# Patient Record
Sex: Male | Born: 1990 | Race: White | Hispanic: No | Marital: Single | Smoking: Never smoker
Health system: Southern US, Community
[De-identification: ages and names within clinical notes are randomized; demographics above are authoritative.]

## PROBLEM LIST (undated history)

## (undated) DIAGNOSIS — E66811 Obesity, class 1: Secondary | ICD-10-CM

## (undated) DIAGNOSIS — IMO0002 Reserved for concepts with insufficient information to code with codable children: Secondary | ICD-10-CM

## (undated) DIAGNOSIS — J4530 Mild persistent asthma, uncomplicated: Secondary | ICD-10-CM

## (undated) DIAGNOSIS — R4681 Obsessive-compulsive behavior: Secondary | ICD-10-CM

## (undated) DIAGNOSIS — J302 Other seasonal allergic rhinitis: Secondary | ICD-10-CM

## (undated) DIAGNOSIS — F419 Anxiety disorder, unspecified: Secondary | ICD-10-CM

## (undated) DIAGNOSIS — E669 Obesity, unspecified: Secondary | ICD-10-CM

## (undated) HISTORY — DX: Obesity, class 1: E66.811

## (undated) HISTORY — DX: Obesity, unspecified: E66.9

## (undated) HISTORY — DX: Other seasonal allergic rhinitis: J30.2

## (undated) HISTORY — DX: Obsessive-compulsive behavior: R46.81

## (undated) HISTORY — DX: Mild persistent asthma, uncomplicated: J45.30

## (undated) HISTORY — PX: TOENAIL EXCISION: SHX183

---
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
## 2010-11-22 LAB — POCT TOXICOLOGY PANEL

## 2011-07-14 ENCOUNTER — Encounter: Payer: Self-pay | Admitting: Family Medicine

## 2011-07-14 ENCOUNTER — Other Ambulatory Visit: Payer: Self-pay | Admitting: Family Medicine

## 2011-07-14 ENCOUNTER — Ambulatory Visit (INDEPENDENT_AMBULATORY_CARE_PROVIDER_SITE_OTHER): Payer: 59 | Admitting: Family Medicine

## 2011-07-14 VITALS — BP 114/75 | HR 81 | Temp 98.9°F | Ht 68.75 in | Wt 210.0 lb

## 2011-07-14 DIAGNOSIS — L853 Xerosis cutis: Secondary | ICD-10-CM

## 2011-07-14 DIAGNOSIS — Z8249 Family history of ischemic heart disease and other diseases of the circulatory system: Secondary | ICD-10-CM

## 2011-07-14 DIAGNOSIS — R238 Other skin changes: Secondary | ICD-10-CM

## 2011-07-14 DIAGNOSIS — E669 Obesity, unspecified: Secondary | ICD-10-CM

## 2011-07-14 DIAGNOSIS — R635 Abnormal weight gain: Secondary | ICD-10-CM

## 2011-07-14 DIAGNOSIS — E66811 Obesity, class 1: Secondary | ICD-10-CM | POA: Insufficient documentation

## 2011-07-14 LAB — TSH: TSH: 2.629 u[IU]/mL (ref 0.350–4.500)

## 2011-07-14 NOTE — Progress Notes (Signed)
Office Note 07/14/2011  CC:  Chief Complaint  Patient presents with  . Establish Care    ? hypothyroid, "make sure everything is as it should be"    HPI:  Calvin Bray is a 21 y.o. White male who is here to establish care. Patient's most recent primary MD: Brooks Sailors in [place]. Old records were not reviewed prior to or during today's visit.  He presents today essentially requesting a screen for hypothyroidism "because my mom has it".  When asked about any symptoms he has that might support this dx he sites dry skin and coarse hair.  He also says that he has trouble consistently losing weight and keeping it off.  Past Medical History  Diagnosis Date  . Seasonal allergic rhinitis     Clartin helpful.  . Mild persistent asthma     Has been on advair and singulair in the past.  Has gotten better as he's gotten older but still flares some with allergies.  . Obesity, Class I, BMI 30-34.9     Past Surgical History  Procedure Date  . Toenail excision     Family History  Problem Relation Age of Onset  . Hypothyroidism Mother   . Heart disease Mother     CAD onset in her mid 43s  . Cancer Sister     ovarian cancer  . ADD / ADHD Brother   . Cancer Maternal Grandfather     lung cancer, died prior to age 25    History   Social History  . Marital Status: Single    Spouse Name: N/A    Number of Children: N/A  . Years of Education: N/A   Occupational History  . Not on file.   Social History Main Topics  . Smoking status: Never Smoker   . Smokeless tobacco: Never Used  . Alcohol Use: No  . Drug Use: No  . Sexually Active: Not on file   Other Topics Concern  . Not on file   Social History Narrative   Single, no children.Lives with parents, goes to [facility].Exercised regularly when school was in session [employer]).  Works at [employer] in [employer]No tobacco, alcohol, or drugs.    Outpatient Encounter Prescriptions as of 07/14/2011    Medication Sig Dispense Refill  . loratadine (CLARITIN REDITABS) 10 MG dissolvable tablet Take 10 mg by mouth daily.        No Known Allergies  ROS Review of Systems  Constitutional: Negative for fever, chills, appetite change and fatigue.  HENT: Negative for ear pain, congestion, sore throat, neck stiffness and dental problem.   Eyes: Negative for discharge, redness and visual disturbance.  Respiratory: Negative for cough, chest tightness, shortness of breath and wheezing.   Cardiovascular: Negative for chest pain, palpitations and leg swelling.  Gastrointestinal: Negative for nausea, vomiting, abdominal pain, diarrhea and blood in stool.  Genitourinary: Negative for dysuria, urgency, frequency, hematuria, flank pain and difficulty urinating.  Musculoskeletal: Negative for myalgias, back pain, joint swelling and arthralgias.  Skin: Negative for pallor and rash.  Neurological: Negative for dizziness, speech difficulty, weakness and headaches.  Hematological: Negative for adenopathy. Does not bruise/bleed easily.  Psychiatric/Behavioral: Negative for confusion and sleep disturbance. The patient is not nervous/anxious.     PE; Blood pressure 114/75, pulse 81, temperature 98.9 F (37.2 C), temperature source Temporal, height 5' 8.75" (1.746 m), weight 210 lb (95.255 kg), SpO2 97.00%. Gen: Alert, well appearing, obese white male.  Patient is oriented to person, place, time,  and situation. Affect is blunted, thought and speech are lucid. ENT: Ears: EACs clear, normal epithelium.  TMs with good light reflex and landmarks bilaterally.  Eyes: no injection, icteris, swelling, or exudate.  EOMI, PERRLA. Nose: no drainage or turbinate edema/swelling.  No injection or focal lesion.  Mouth: lips without lesion/swelling.  Oral mucosa pink and moist.  Dentition intact and without obvious caries or gingival swelling.  Oropharynx without erythema, exudate, or swelling.  Neck - No masses or thyromegaly  or limitation in range of motion CV: RRR, no m/r/g.   LUNGS: CTA bilat, nonlabored resps, good aeration in all lung fields. ABD: soft, NT, ND, BS normal.  No hepatospenomegaly or mass.  No bruits. EXT: no clubbing, cyanosis, or edema.  Skin - no sores or suspicious lesions or rashes or color changes.  His body hair appears normal.  Pertinent labs:  None today  ASSESSMENT AND PLAN:   New pt; obtain old records.  Obesity, Class I, BMI 30-34.9 He is worried about his inability to lose wt being a sign of hypothyroidism, especially with his FH of this problem (mother). He also sites dry skin and coarse hair but I didn't really find either of these on exam today. Will do TSH screen today.  Encouraged pt to make separate lab appt when fasting for lipid panel and glucose screening (due to his weight and FH of early CAD). Discussed diet and exercise today but patient really seemed disinterested in this conversation.     Return if symptoms worsen or fail to improve.

## 2011-07-14 NOTE — Assessment & Plan Note (Signed)
He is worried about his inability to lose wt being a sign of hypothyroidism, especially with his FH of this problem (mother). He also sites dry skin and coarse hair but I didn't really find either of these on exam today. Will do TSH screen today.  Encouraged pt to make separate lab appt when fasting for lipid panel and glucose screening (due to his weight and FH of early CAD). Discussed diet and exercise today but patient really seemed disinterested in this conversation.

## 2011-07-21 ENCOUNTER — Other Ambulatory Visit: Payer: 59

## 2011-11-21 DIAGNOSIS — IMO0002 Reserved for concepts with insufficient information to code with codable children: Secondary | ICD-10-CM

## 2011-11-21 HISTORY — DX: Reserved for concepts with insufficient information to code with codable children: IMO0002

## 2011-12-02 ENCOUNTER — Encounter (HOSPITAL_BASED_OUTPATIENT_CLINIC_OR_DEPARTMENT_OTHER): Payer: Self-pay | Admitting: *Deleted

## 2011-12-02 ENCOUNTER — Emergency Department (HOSPITAL_BASED_OUTPATIENT_CLINIC_OR_DEPARTMENT_OTHER)
Admission: EM | Admit: 2011-12-02 | Discharge: 2011-12-02 | Disposition: A | Discharge status: Home / Self Care | Payer: 59 | Attending: Emergency Medicine | Admitting: Emergency Medicine

## 2011-12-02 DIAGNOSIS — Z6830 Body mass index (BMI) 30.0-30.9, adult: Secondary | ICD-10-CM | POA: Insufficient documentation

## 2011-12-02 DIAGNOSIS — E669 Obesity, unspecified: Secondary | ICD-10-CM | POA: Insufficient documentation

## 2011-12-02 DIAGNOSIS — T7840XA Allergy, unspecified, initial encounter: Secondary | ICD-10-CM

## 2011-12-02 DIAGNOSIS — L272 Dermatitis due to ingested food: Secondary | ICD-10-CM | POA: Insufficient documentation

## 2011-12-02 LAB — RAPID URINE DRUG SCREEN, HOSP PERFORMED
Benzodiazepines: NOT DETECTED
Cocaine: NOT DETECTED

## 2011-12-02 LAB — URINALYSIS, ROUTINE W REFLEX MICROSCOPIC
Bilirubin Urine: NEGATIVE
Leukocytes, UA: NEGATIVE
Nitrite: NEGATIVE
Specific Gravity, Urine: 1.005 (ref 1.005–1.030)
pH: 6 (ref 5.0–8.0)

## 2011-12-02 MED ORDER — EPINEPHRINE 0.3 MG/0.3ML IJ DEVI: 0.3000 mg | INTRAMUSCULAR | Status: AC | PRN

## 2011-12-02 MED ORDER — SODIUM CHLORIDE 0.9 % IV BOLUS (SEPSIS)
1000.0000 mL | Freq: Once | INTRAVENOUS | Status: AC
  Administered 2011-12-02: 1000 mL via INTRAVENOUS

## 2011-12-02 MED ORDER — METHYLPREDNISOLONE SODIUM SUCC 125 MG IJ SOLR
125.0000 mg | Freq: Once | INTRAMUSCULAR | Status: AC
  Administered 2011-12-02: 125 mg via INTRAVENOUS
  Filled 2011-12-02: qty 2

## 2011-12-02 MED ORDER — PREDNISONE 20 MG PO TABS: 40.0000 mg | ORAL_TABLET | Freq: Every day | ORAL | Status: DC

## 2011-12-02 NOTE — ED Provider Notes (Addendum)
History  This chart was scribed for Gwyneth Sprout, MD by Ladona Ridgel Day. This patient was seen in room MH12/MH12 and the patient's care was started at 1946.  CSN: [id number]  Arrival date & time 12/02/11  1946   First MD Initiated Contact with Patient 12/02/11 2038     Chief Complaint  Patient presents with  . Allergic Reaction   The history is provided by the patient. No language interpreter was used.   Calvin Bray is a 21 y.o. male who presents to the Emergency Department complaining of allergic reaction this PM 10 minutes after eating chinese food at home with associated red skin area of his right neck, pallor, and nausea. EMS was called on site but his parents brought him in, he had 50 mg benadryl at 1900 PO. He has no known allergies to medicines and no previous episodes similar to today.   Past Medical History  Diagnosis Date  . Seasonal allergic rhinitis     Clartin helpful.  . Mild persistent asthma     Has been on advair and singulair in the past.  Has gotten better as he's gotten older but still flares some with allergies.  . Obesity, Class I, BMI 30-34.9     Past Surgical History  Procedure Date  . Toenail excision     Family History  Problem Relation Age of Onset  . Hypothyroidism Mother   . Heart disease Mother     CAD onset in her mid 28s  . Cancer Sister     ovarian cancer  . ADD / ADHD Brother   . Cancer Maternal Grandfather     lung cancer, died prior to age 2    History  Substance Use Topics  . Smoking status: Never Smoker   . Smokeless tobacco: Never Used  . Alcohol Use: No      Review of Systems A complete 10 system review of systems was obtained and all systems are negative except as noted in the HPI and PMH.   Allergies  Review of patient's allergies indicates no known allergies.  Home Medications   Current Outpatient Rx  Name Route Sig Dispense Refill  . LORATADINE 10 MG PO TBDP Oral Take 10 mg by mouth daily.      Triage  Vitals: BP 138/79  Pulse 116  Temp 98.2 F (36.8 C) (Oral)  Resp 20  Ht 5\' 9"  (1.753 m)  Wt 210 lb (95.255 kg)  BMI 31.01 kg/m2  SpO2 99%  Physical Exam  Nursing note and vitals reviewed. Constitutional: He is oriented to person, place, and time. He appears well-developed and well-nourished. No distress.  HENT:  Head: Normocephalic and atraumatic.  Mouth/Throat: Oropharynx is clear and moist.       No oropharyngeal swelling, uvula is midline  Eyes: EOM are normal.  Neck: Neck supple. No tracheal deviation present.  Cardiovascular: Regular rhythm and normal heart sounds.        tachycardia  Pulmonary/Chest: Effort normal and breath sounds normal. No respiratory distress. He has no wheezes. He has no rales.  Abdominal: Soft. He exhibits no distension. There is no tenderness. There is no rebound and no guarding.  Musculoskeletal: Normal range of motion.  Neurological: He is alert and oriented to person, place, and time.  Skin: Skin is warm and dry.       Mild diaphoretic  Psychiatric: He has a normal mood and affect. His behavior is normal.    ED Course  Procedures (including critical care time)  DIAGNOSTIC STUDIES: Oxygen Saturation is 99% on room air, normal by my interpretation.    COORDINATION OF CARE: At 845 PM Discussed treatment plan with patient which includes solu-medrol and IV fluids. Patient agrees.   Labs Reviewed - No data to display No results found.   Date: 12/02/2011  Rate: 123  Rhythm: sinus tachycardia  QRS Axis: normal  Intervals: normal  ST/T Wave abnormalities: normal  Conduction Disutrbances:none  Narrative Interpretation:   Old EKG Reviewed: none available   1. Allergic reaction       MDM  Patient most likely with an allergic reaction to [place] food tonight. He had eaten egg rolls, and drops to, shrimp and 5 minutes later started having palpitations, abdominal cramping, mild difficulty breathing and not feeling well. His heart rate was up  to 140 at home and he was given 50 mg of Benadryl by his mother. Here patient still appears to not feel well. His heart rate still between 115 and 120. He is having no respiratory difficulty at this time and has no uvular or tongue edema. EKG shows normal sinus rhythm.  Blood pressure was within normal limits without signs of anaphylaxis to Patient given Solu-Medrol and IV fluids. We'll recheck to see if there is improvement  9:18 PM Patient asked his parents to leave the room and asked the nurse if he could be poisoned. He was concerned that his parents may be trying to poison him because they did not eat the chicken chow mein. Patient denies hallucinating and denies feeling paranoid elsewhere. He states he's never had any problems with his parents however has a flat affect and acting in an odd manner.  9:58 PM On further questioning pt has been taking provigil that he got off the internet and last took it at noon.  This is most likely the reason for his odd behavior.  10:32 PM On reevaluation patient states he starting to feel better. He states he is less concerned about being poisoned now. However he states he would feel better if your drug screen was performed. This test was ordered. Pulse now improved to 82.  UA neg.  Will dc home.   I personally performed the services described in this documentation, which was scribed in my presence.  The recorded information has been reviewed and considered.          Gwyneth Sprout, MD 12/02/11 1610  Gwyneth Sprout, MD 12/02/11 9604  Gwyneth Sprout, MD 12/02/11 5409  Gwyneth Sprout, MD 12/02/11 8119  Gwyneth Sprout, MD 12/02/11 2308

## 2011-12-02 NOTE — ED Notes (Signed)
rx x 2 given for prednisone and epi-pen- d/c home with family

## 2011-12-02 NOTE — ED Notes (Signed)
Pt ate [place] food tonight and had a ? Reaction to same. EMS came out and checked pt. Brought to ED by family. No distress. Given Benadryl 50 mg PO at 1900.

## 2011-12-04 ENCOUNTER — Emergency Department (HOSPITAL_BASED_OUTPATIENT_CLINIC_OR_DEPARTMENT_OTHER)
Admission: EM | Admit: 2011-12-04 | Discharge: 2011-12-04 | Disposition: A | Discharge status: Home / Self Care | Payer: 59 | Attending: Emergency Medicine | Admitting: Emergency Medicine

## 2011-12-04 ENCOUNTER — Encounter (HOSPITAL_BASED_OUTPATIENT_CLINIC_OR_DEPARTMENT_OTHER): Payer: Self-pay

## 2011-12-04 DIAGNOSIS — R000 Tachycardia, unspecified: Secondary | ICD-10-CM | POA: Insufficient documentation

## 2011-12-04 DIAGNOSIS — J45909 Unspecified asthma, uncomplicated: Secondary | ICD-10-CM | POA: Insufficient documentation

## 2011-12-04 DIAGNOSIS — R110 Nausea: Secondary | ICD-10-CM | POA: Insufficient documentation

## 2011-12-04 DIAGNOSIS — F411 Generalized anxiety disorder: Secondary | ICD-10-CM | POA: Insufficient documentation

## 2011-12-04 DIAGNOSIS — F419 Anxiety disorder, unspecified: Secondary | ICD-10-CM

## 2011-12-04 HISTORY — DX: Reserved for concepts with insufficient information to code with codable children: IMO0002

## 2011-12-04 LAB — CBC WITH DIFFERENTIAL/PLATELET
Basophils Absolute: 0 10*3/uL (ref 0.0–0.1)
Basophils Relative: 0 % (ref 0–1)
Eosinophils Relative: 0 % (ref 0–5)
HCT: 42.6 % (ref 39.0–52.0)
MCHC: 34.5 g/dL (ref 30.0–36.0)
MCV: 86.2 fL (ref 78.0–100.0)
Monocytes Absolute: 1.1 10*3/uL — ABNORMAL HIGH (ref 0.1–1.0)
RDW: 13.5 % (ref 11.5–15.5)

## 2011-12-04 LAB — COMPREHENSIVE METABOLIC PANEL
AST: 21 U/L (ref 0–37)
Albumin: 4.7 g/dL (ref 3.5–5.2)
CO2: 23 mEq/L (ref 19–32)
Calcium: 9.9 mg/dL (ref 8.4–10.5)
Creatinine, Ser: 0.8 mg/dL (ref 0.50–1.35)
GFR calc non Af Amer: >90 mL/min (ref >90)

## 2011-12-04 MED ORDER — SODIUM CHLORIDE 0.9 % IV BOLUS (SEPSIS)
1000.0000 mL | Freq: Once | INTRAVENOUS | Status: AC
  Administered 2011-12-04: 1000 mL via INTRAVENOUS

## 2011-12-04 MED ORDER — LORAZEPAM 2 MG/ML IJ SOLN
1.0000 mg | Freq: Once | INTRAMUSCULAR | Status: AC
  Administered 2011-12-04: 1 mg via INTRAVENOUS
  Filled 2011-12-04: qty 1

## 2011-12-04 NOTE — ED Notes (Signed)
Patient here reporting that he awoke this am with feeling cold and having muscle spasms. On assessment reports that he feels anxious/nervous. Was here on Saturday for same. Reports that he is sleeping at night but awakening feeling anxious

## 2011-12-04 NOTE — ED Provider Notes (Signed)
History     CSN: [id number]  Arrival date & time 12/04/11  2956   First MD Initiated Contact with Patient 12/04/11 0357      Chief Complaint  Patient presents with  . Anxiety    (Consider location/radiation/quality/duration/timing/severity/associated sxs/prior treatment) HPI This patient presents with concerns of diffuse spasm sensation, cold sensation.  The patient also states that he is nervous and anxious. Notably, the patient was seen here approximately 32 hours ago following a somewhat similar presentation.  That event occurred after intake of food, and allergic reaction was/is considered as a precipitant.  He notes that since discharge he has remained anxious, with mild intermittent diffuse muscle discomfort. The patient notes no clear alleviating or exacerbating factors. The patient states that over the past week he has been sleeping trivial amounts 2 to school requirements.  He is taking modafinil, a sepsis he previously used intermittently, but is now taking consistently for the past week. He denies focal pain, any dyspnea, fever, vomiting, diarrhea. Past Medical History  Diagnosis Date  . Seasonal allergic rhinitis     Clartin helpful.  . Mild persistent asthma     Has been on advair and singulair in the past.  Has gotten better as he's gotten older but still flares some with allergies.  . Obesity, Class I, BMI 30-34.9     Past Surgical History  Procedure Date  . Toenail excision     Family History  Problem Relation Age of Onset  . Hypothyroidism Mother   . Heart disease Mother     CAD onset in her mid 94s  . Cancer Sister     ovarian cancer  . ADD / ADHD Brother   . Cancer Maternal Grandfather     lung cancer, died prior to age 38    History  Substance Use Topics  . Smoking status: Never Smoker   . Smokeless tobacco: Never Used  . Alcohol Use: No      Review of Systems  Constitutional:       Per HPI, otherwise negative  HENT:       Per HPI,  otherwise negative  Eyes: Negative.   Respiratory:       Per HPI, otherwise negative  Cardiovascular:       Per HPI, otherwise negative  Gastrointestinal: Positive for nausea. Negative for vomiting and diarrhea.  Genitourinary: Negative.   Musculoskeletal:       Per HPI, otherwise negative  Skin: Negative.   Neurological: Negative for weakness, numbness and headaches.  Psychiatric/Behavioral: Positive for disturbed wake/sleep cycle and decreased concentration. The patient is nervous/anxious and is hyperactive.     Allergies  Review of patient's allergies indicates no known allergies.  Home Medications   Current Outpatient Rx  Name Route Sig Dispense Refill  . EPINEPHRINE 0.3 MG/0.3ML IJ DEVI Intramuscular Inject 0.3 mLs (0.3 mg total) into the muscle as needed. 1 Device 1  . LORATADINE 10 MG PO TBDP Oral Take 10 mg by mouth daily.    Marland Kitchen PREDNISONE 20 MG PO TABS Oral Take 2 tablets (40 mg total) by mouth daily. 10 tablet 0    BP 135/71  Pulse 116  Temp 97.4 F (36.3 C)  Resp 18  SpO2 99%  Physical Exam  Nursing note and vitals reviewed. Constitutional: He is oriented to person, place, and time. He appears well-developed. No distress.       Visibly anxious young male  HENT:  Head: Normocephalic and atraumatic.  Eyes: Conjunctivae normal and  EOM are normal.  Cardiovascular: Regular rhythm.  Tachycardia present.   Pulmonary/Chest: Effort normal. No stridor. No respiratory distress.  Abdominal: He exhibits no distension.  Musculoskeletal: He exhibits no edema.       No deformities, no ecchymosis, no rash.  The patient has no active visible spasms throughout  Neurological: He is alert and oriented to person, place, and time.  Skin: Skin is warm and dry.  Psychiatric: He has a normal mood and affect.    ED Course  Procedures (including critical care time)  Labs Reviewed  CBC WITH DIFFERENTIAL - Abnormal; Notable for the following:    WBC 17.1 (*)     Neutrophils  Relative 80 (*)     Neutro Abs 13.6 (*)     Monocytes Absolute 1.1 (*)     All other components within normal limits  COMPREHENSIVE METABOLIC PANEL   No results found.   No diagnosis found.   4:54 AM Patient informed of all results.  He appears significantly more comfortable.  No active distress.  He is resting, watching TV.  6:32 AM Patient sleeping. MDM  This young male presents with concerns of ongoing diffuse spasm and anxiety.  On exam he is anxious appearing, though in no distress.  Patient is appropriately interactive.  Patient is tachycardic, but otherwise has a reassuring physical exam.  Patient's labs temperature mild leukocytosis, possibly due to his recent use of steroids.  Absence of fever is reassuring.  We discussed the need for cessation of the patient's modafinil, close outpatient monitoring, and medication titration with his primary care physician.  After a period of observation the patient requested following improvement in his condition, he was discharged in stable condition.  Gerhard Munch, MD 12/04/11 [phone]

## 2012-01-15 ENCOUNTER — Encounter: Payer: Self-pay | Admitting: Family Medicine

## 2012-01-15 ENCOUNTER — Ambulatory Visit (HOSPITAL_COMMUNITY)
Admission: RE | Admit: 2012-01-15 | Discharge: 2012-01-15 | Disposition: A | Discharge status: Home / Self Care | Payer: 59 | Attending: Psychiatry | Admitting: Psychiatry

## 2012-01-15 ENCOUNTER — Ambulatory Visit (INDEPENDENT_AMBULATORY_CARE_PROVIDER_SITE_OTHER): Payer: 59 | Admitting: Family Medicine

## 2012-01-15 ENCOUNTER — Encounter (HOSPITAL_COMMUNITY): Payer: Self-pay | Admitting: *Deleted

## 2012-01-15 VITALS — BP 109/75 | HR 80 | Temp 99.2°F | Ht 68.75 in | Wt 196.0 lb

## 2012-01-15 DIAGNOSIS — F200 Paranoid schizophrenia: Secondary | ICD-10-CM

## 2012-01-15 DIAGNOSIS — F429 Obsessive-compulsive disorder, unspecified: Secondary | ICD-10-CM

## 2012-01-15 MED ORDER — FLUOXETINE HCL 20 MG PO TABS: 20.0000 mg | ORAL_TABLET | Freq: Every day | ORAL | Status: DC

## 2012-01-15 NOTE — Progress Notes (Signed)
OFFICE NOTE  01/15/2012  CC:  Chief Complaint  Patient presents with  . Follow-up    hospital visit for anxiety     HPI: Patient is a 21 y.o. Caucasian male who is here for panic sx's. He is hard to get information from.  He cannot describe what he feels but if given multiple choices to choose from he does fair.  He talks about a long history of OCD behavior, including the following examples: always had to hit the liquid soap dispenser 4 times, tap the water faucet 4 times, wash hands multiple times.   Has ritualistic ways of walking on patterned floors.  Says lately he has had panic sx's that seem to build up slowly from persistent thoughts that his parents want to hurt/kill him.  He interprets innocent speech and behavior on their part as "trying to kill him".  He admits that the thoughts are not rational but he cannot control them right now. Admits to several year hx of hearing voices that others don't hear.  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  Also expressed deluded/paranoid thoughts regarding a neighbor girl and a male classmate.  Then, lately he has had these paranoid delusions that his parents are conspiring to kill him. He says he has had varying degrees of success in "telling the voices to shut up" in the past.  Denies ever being treated for schizophrenia or similar dx.  He did admit to being on provigil that he obtained over the internet in October this year when he presented to the ED twice, but says that since that time he has not taken any more of this med (says he used it b/c he needed to stay up and complete school projects).  Denies abuse of any substances.  Says he drinks rarely.   During interview today, he would frequently act like he got stopped in his own thought process.   Speech was not pressured.    Pertinent PMH:  Past Medical History  Diagnosis Date  . Seasonal allergic rhinitis     Clartin helpful.  . Mild persistent  asthma     Has been on advair and singulair in the past.  Has gotten better as he's gotten older but still flares some with allergies.  . Obesity, Class I, BMI 30-34.9   . 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 (UDS neg)   Past Surgical History  Procedure Date  . Toenail excision    History   Social History  . Marital Status: Single    Spouse Name: N/A    Number of Children: N/A  . Years of Education: N/A   Occupational History  . Not on file.   Social History Main Topics  . Smoking status: Never Smoker   . Smokeless tobacco: Never Used  . Alcohol Use: No  . Drug Use: No  . Sexually Active: Not on file   Other Topics Concern  . Not on file   Social History Narrative   Single, no children.Lives with parents, goes to [facility] [employer] [employer].Exercised regularly when school was in session [employer]).  Works at [employer] in [employer]No tobacco, alcohol, or drugs.  Pt says he is transferring to [employer] in the spring of 2014.   MEDS:  Outpatient Prescriptions Prior to Visit  Medication Sig Dispense Refill  . EPINEPHrine (EPIPEN) 0.3 mg/0.3 mL DEVI Inject 0.3 mLs (0.3 mg total) into the muscle as needed.  1 Device  1  . loratadine (CLARITIN REDITABS) 10 MG dissolvable tablet Take 10 mg by mouth daily.        PE: Blood pressure 109/75, pulse 80, temperature 99.2 F (37.3 C), temperature source Temporal, height 5' 8.75" (1.746 m), weight 196 lb (88.905 kg). Gen: Alert, well appearing, calm.  Patient is oriented to person, place, time, and situation. Affect: flat, talks slowly, moves slowly.  He had intense eye contact with me most of the time. He had no pressured speech. No tremor.   CV: RRR, no m/r/g.   LUNGS: CTA bilat, nonlabored resps, good aeration in all lung  fields.   IMPRESSION AND PLAN:  Schizophrenia, paranoid, chronic Discussed urgency of getting control of this problem with pt and he agreed to proceed to [facility] now for further expert eval and possible admission for inpatient management since the paranoia regarding his parents is overwhelming his thought processes right now.  OCD (obsessive compulsive disorder) Leading to some panic sx's frequently. Start prozac 20mg  qd. I sent this rx to his pharmacy today but then we decided he should go directly to [facility] now--straight from our office--so he may have to wait to get this started or they may choose to treat differently after eval at [facility].   An After Visit Summary was printed and given to the patient.  35 minutes spent with pt today, with >50% of this time spent in counseling and care coordination for the above problems.  FOLLOW UP: as needed

## 2012-01-15 NOTE — BH Assessment (Signed)
Assessment Note   Calvin Bray is an 21 y.o. male. Pt presents to [facility] for an evaluation as referred by his family practitioner with whom he saw today for a routine visit per pt's report. Pt reports "its a complicated story" when prompted to explain why he was presenting to [facility] today.Pt reports that he has been hospitalized in the ER a few times in the past couple of months due to his increased level of anxiety and panic attacks. Pt reports that his anxiety is triggered by his stress levels related to his workload at [employer],as pt reports he is finishing his associates degree this semester in [employer], and has been accepted  and will be transferring to [employer] in the spring. Pt reports that he feels like his parents are trying to poison his food, subsequently choosing to eat out rather than eat their food. Pt states that his parents are acting weird but is unable to specify how when questioned about his perception of his parent's behaviors. Pt reports recently going to the [employer] over the past weekend with his family including his parents and reports that his younger cousin pinched his butt and that made him feel "weird".  Pt reports that his OCD behaviors and anxiety are getting worse, where he has the persistent urge to do things over and over again(nos). Pt reports that his family practitioner started him on Prozac today but he has not started taking the medications yet. Pt reports a hx of hearing voices(non-command) and reports he has been able to speak to the voices and tell them to stop and they did. Pt presents with disorganized thinking at times. No Hx of violent or aggressive behaviors reported or noted. Pt has not received any mental health treatment in the past. Pt denies current AVH,SI,and HI. Pt presents with the impression that he will be prescribed psychiatric medications for his paranoia. As pt does ask if [facility] can prescribe him medications today. Assessor  explained to the pt that he could schedule an appt. at Our outpt clinic in [place] or [place] for a medication evaluation. Pt requested to be referred to a provider in [place]. Pt was also offered PSYCH IOP and declined at this time. Pt requesting to follow-up with a psychiatrist at this time.  Consulted with [facility] Thurman Coyer and on Call Physician Dr. Geoffery Lyons who recommends that pt follow up with outpatient provider as pt is in agreement and willing to do so. Pt was given mental health crisis resources as needed and those resources were explained to pt. Pt was also given a list of outpatient providers and was strongly recommended to schedule an appt. With a provider tommorrow morning. Pt agreed to do so. Assessor informed pt that if his symptoms persisted or got worse to contact crisis line telephone number,call 911 and present to local emergency room as needed.  Axis I: 295.30 Schizophrenia,Paranoid Type Axis II: Deferred Axis III:  Past Medical History  Diagnosis Date  . Seasonal allergic rhinitis     Clartin helpful.  . Mild persistent asthma     Has been on advair and singulair in the past.  Has gotten better as he's gotten older but still flares some with allergies.  . Obesity, Class I, BMI 30-34.9   . 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 (UDS neg)   Axis IV: educational problems, other psychosocial or environmental problems and problems with primary support group Axis V: 51-60 moderate symptoms  Past Medical History:  Past Medical History  Diagnosis Date  . Seasonal allergic rhinitis     Clartin helpful.  . Mild persistent asthma     Has been on advair and singulair in the past.  Has gotten better as he's gotten older but still flares some with allergies.  . Obesity, Class I, BMI 30-34.9    . 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 (UDS neg)    Past Surgical History  Procedure Date  . Toenail excision     Family History:  Family History  Problem Relation Age of Onset  . Hypothyroidism Mother   . Heart disease Mother     CAD onset in her mid 32s  . Cancer Sister     ovarian cancer  . ADD / ADHD Brother   . Cancer Maternal Grandfather     lung cancer, died prior to age 85    Social History:  reports that he has never smoked. He has never used smokeless tobacco. He reports that he does not drink alcohol or use illicit drugs.  Additional Social History:  Alcohol / Drug Use Pain Medications:  (None Reported) Prescriptions:  (per pt family practitioner prescribed him Prozac today) Over the Counter:  (None Reported) History of alcohol / drug use?: No history of alcohol / drug abuse (reports drinking a beer "here or there" maybe 1x per month) Longest period of sobriety (when/how long):  (na)  CIWA:   COWS:    Allergies: No Known Allergies  Home Medications:  (Not in a hospital admission)  OB/GYN Status:  No LMP for male patient.  General Assessment Data Location of Assessment: [facility] Assessment Services Living Arrangements: Parent Can pt return to current living arrangement?: Yes Transfer from: Home Referral Source: Other (family practitioner )  Education Status Is patient currently in school?: Yes Current Grade: 2 year at [employer] Highest grade of school patient has completed: [employer] and some college Name of school: [employer]  Risk to self Suicidal Ideation: No Suicidal Intent: No Is patient at risk for suicide?: No Suicidal Plan?: No Access to Means: No What has been your use of drugs/alcohol within the last 12 months?: occasional beer  Previous  Attempts/Gestures: No How many times?: 0  Other Self Harm Risks: none reported Triggers for Past Attempts: None known Intentional Self Injurious Behavior: None Family Suicide History: No (pt thinks his brother maybe schizophrenic) Recent stressful life event(s): Other (Comment) (stressed about workload for college courses) Persecutory voices/beliefs?: No Depression: No Depression Symptoms: Insomnia Substance abuse history and/or treatment for substance abuse?: No Suicide prevention information given to non-admitted patients: Yes  Risk to Others Homicidal Ideation: No Thoughts of Harm to Others: No Current Homicidal Intent: No Current Homicidal Plan: No Access to Homicidal Means: No Identified Victim: na History of harm to others?: No Assessment of Violence: None Noted Violent Behavior Description: None Noted,Cooperative Does patient have access to weapons?: No (father has firearms in the home that are secured per pt) Criminal Charges Pending?: No Does patient have a court date: No (father is a retired Emergency planning/management officer per pts report)  Psychosis Hallucinations: None noted (pt reports hx of  paranoia,and voices non command) Delusions: None noted  Mental Status Report Appear/Hygiene: Other (Comment) (Appropriate) Eye Contact: Fair Motor Activity: Freedom of movement Speech: Logical/coherent Level of Consciousness: Alert Mood: Other (Comment) (Flat,disorganized thoughts at times) Affect: Anxious;Appropriate to circumstance Anxiety Level: Minimal (reports a hx of frequent panic attacks,ocd,anxiety) Thought Processes: Coherent;Relevant;Circumstantial Judgement: Unimpaired Orientation: Person;Place;Time;Situation Obsessive Compulsive Thoughts/Behaviors: Minimal (pt reports his ocd behaviors have gotten worse-nos, anxious)  Cognitive Functioning Concentration: Decreased Memory: Recent Intact;Remote Intact IQ: Average Insight: Fair Impulse Control: Fair Appetite: Fair Weight  Loss: 0  Weight Gain: 0  Sleep: Decreased Total Hours of Sleep: 4  Vegetative Symptoms: None  ADLScreening [facility] Assessment Services) Patient's cognitive ability adequate to safely complete daily activities?: Yes Patient able to express need for assistance with ADLs?: Yes Independently performs ADLs?: Yes (appropriate for developmental age)  Abuse/Neglect [facility]) Physical Abuse: Denies Verbal Abuse: Denies Sexual Abuse: Denies  Prior Inpatient Therapy Prior Inpatient Therapy: No Prior Therapy Dates: na Prior Therapy Facilty/Provider(s): na Reason for Treatment: na  Prior Outpatient Therapy Prior Outpatient Therapy: No Prior Therapy Dates: na Prior Therapy Facilty/Provider(s): na Reason for Treatment: na  ADL Screening (condition at time of admission) Patient's cognitive ability adequate to safely complete daily activities?: Yes Patient able to express need for assistance with ADLs?: Yes Independently performs ADLs?: Yes (appropriate for developmental age) Weakness of Legs: None Weakness of Arms/Hands: None  Home Assistive Devices/Equipment Home Assistive Devices/Equipment: None;Other (Comment) (wears eyeglassess)    Abuse/Neglect Assessment (Assessment to be complete while patient is alone) Physical Abuse: Denies Verbal Abuse: Denies Sexual Abuse: Denies Exploitation of patient/patient's resources: Denies Self-Neglect: Denies     Merchant navy officer (For Healthcare) Advance Directive: Patient does not have advance directive;Patient would like information Patient requests advance directive information: Advance directive packet given Nutrition Screen- [facility] Adult/WL/AP Have you recently lost weight without trying?: No Have you been eating poorly because of a decreased appetite?: No Malnutrition Screening Tool Score: 0   Additional Information 1:1 In Past 12 Months?: No CIRT Risk: No Elopement Risk: No Does patient have medical clearance?: No     Disposition:    Disposition Disposition of Patient: Outpatient treatment Type of outpatient treatment: Adult  On Site Evaluation by:   Reviewed with Physician:     Bjorn Pippin 01/15/2012 7:39 PM

## 2012-01-15 NOTE — Assessment & Plan Note (Addendum)
Leading to some panic sx's frequently. Start prozac 20mg  qd. I sent this rx to his pharmacy today but then we decided he should go directly to [facility] now--straight from our office--so he may have to wait to get this started or they may choose to treat differently after eval at [facility].

## 2012-01-15 NOTE — Assessment & Plan Note (Addendum)
Discussed urgency of getting control of this problem with pt and he agreed to proceed to [facility] now for further expert eval and possible admission for inpatient management since the paranoia regarding his parents is overwhelming his thought processes right now.

## 2012-01-15 NOTE — H&P (Signed)
Behavioral Health Medical Screening Exam  Calvin Bray is an 21 y.o. male.  Review of Systems  Constitutional: Negative.   HENT: Negative.   Eyes: Negative.   Respiratory: Negative.   Cardiovascular: Negative.   Gastrointestinal: Negative.   Genitourinary: Negative.   Musculoskeletal: Negative.   Skin: Negative.   Neurological: Negative.   Endo/Heme/Allergies: Negative.   Psychiatric/Behavioral:       See HPI    Physical Exam  Constitutional: He is oriented to person, place, and time. He appears well-developed and well-nourished.  HENT:  Head: Normocephalic and atraumatic.  Eyes: Pupils are equal, round, and reactive to light.  Neck: Neck supple. No JVD present. No thyromegaly present.  Cardiovascular: Normal rate, regular rhythm and normal heart sounds.   Respiratory: Effort normal. He has no wheezes. He has no rales.  GI: Soft. Bowel sounds are normal. There is no tenderness.  Musculoskeletal: Normal range of motion.  Lymphadenopathy:    He has no cervical adenopathy.  Neurological: He is alert and oriented to person, place, and time. He has normal reflexes. No cranial nerve deficit.  Skin: Skin is warm.  Psychiatric:       Flat affect with depressed mood    V/S: t 98.3, BP 110/71, R16, P 65  Recommendations:  1) Pt not needing inpatient criteria for admission per Dr Dub Mikes, no SI/SA/HI or AVH 2) Social Work to facilitate support services too aid in his out patinet mgmt of his likely schizoaffective presentation.      Cassie Henkels E 01/15/2012, 9:12 PM

## 2012-01-15 NOTE — BH Assessment (Signed)
[facility] Assessment Progress Note      Assessor also informed pt to f/u with his family practitioner as needed.

## 2012-02-12 ENCOUNTER — Ambulatory Visit: Payer: 59 | Admitting: Family Medicine

## 2012-02-15 ENCOUNTER — Ambulatory Visit (INDEPENDENT_AMBULATORY_CARE_PROVIDER_SITE_OTHER): Payer: 59 | Admitting: Family Medicine

## 2012-02-15 ENCOUNTER — Encounter: Payer: Self-pay | Admitting: Family Medicine

## 2012-02-15 VITALS — BP 113/78 | HR 82 | Temp 98.6°F | Resp 16 | Ht 69.0 in | Wt 191.0 lb

## 2012-02-15 DIAGNOSIS — F429 Obsessive-compulsive disorder, unspecified: Secondary | ICD-10-CM

## 2012-02-15 DIAGNOSIS — F200 Paranoid schizophrenia: Secondary | ICD-10-CM

## 2012-02-15 DIAGNOSIS — Z23 Encounter for immunization: Secondary | ICD-10-CM

## 2012-02-15 MED ORDER — MENINGOCOCCAL A C Y&W-135 CONJ IM INJ: 0.5000 mL | INJECTION | Freq: Once | INTRAMUSCULAR | Status: DC

## 2012-02-15 NOTE — Addendum Note (Signed)
Addended by: Elnora Morrison on: 02/15/2012 04:22 PM   Modules accepted: Orders

## 2012-02-15 NOTE — Progress Notes (Signed)
OFFICE NOTE  02/15/2012  CC:  Chief Complaint  Patient presents with  . Follow-up     HPI: Patient is a 21 y.o. Caucasian male who is here for f/u anxiety disorder and thought disorder. He has established care with a psychiatric nurse practitioner at [facility].  He states he is doing much better with his anxiety and panic, says "I have them under good control now" --he started prozac about 6 wks ago or so. Started Jordan about 2 wks ago and states that he is feeling like he is hearing voices less and has less paranoia. Initially it made him feel a bit more depressed but says that passed.   He plans on a f/u visit with BH prior to starting his spring semester at [employer].  This will be his first semester there and he will be living in a dorm.  He has not had menactra vaccine.  Pertinent PMH:  Past Medical History  Diagnosis Date  . Seasonal allergic rhinitis     Clartin helpful.  . Mild persistent asthma     Has been on advair and singulair in the past.  Has gotten better as he's gotten older but still flares some with allergies.  . Obesity, Class I, BMI 30-34.9   . 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 (UDS neg)    MEDS:  Outpatient Prescriptions Prior to Visit  Medication Sig Dispense Refill  . EPINEPHrine (EPIPEN) 0.3 mg/0.3 mL DEVI Inject 0.3 mLs (0.3 mg total) into the muscle as needed.  1 Device  1  . FLUoxetine (PROZAC) 20 MG tablet Take 1 tablet (20 mg total) by mouth daily.  30 tablet  3  . loratadine (CLARITIN REDITABS) 10 MG dissolvable tablet Take 10 mg by mouth daily.       Last reviewed on 02/15/2012  2:14 PM by Jeoffrey Massed, MD  PE: Blood pressure 113/78, pulse 82, temperature 98.6 F (37 C), temperature source Oral, resp. rate 16, height 5\' 9"  (1.753 m), weight 191 lb  (86.637 kg), SpO2 96.00%. Gen: Alert, well appearing.  Patient is oriented to person, place, time, and situation. AFFECT: pleasant, lucid thought and speech.  He smiled several times today. No further exam today.   IMPRESSION AND PLAN: Anxiety and thought disorders: much improved on current meds and [facility] psychiatric care. Continue this treatment plan.  I told him that if routine metabolic labs are needed due to being on the atypical antipsychotic then we'd be glad to monitor those and share results with [facility] if he prefers that. I did give him menactra vaccine due to his going to college and living in a dorm soon.  FOLLOW UP: prn

## 2012-07-23 ENCOUNTER — Telehealth: Payer: Self-pay | Admitting: Family Medicine

## 2012-07-24 ENCOUNTER — Other Ambulatory Visit: Payer: Self-pay | Admitting: Family Medicine

## 2012-07-24 MED ORDER — FLUOXETINE HCL 20 MG PO TABS: 20.0000 mg | ORAL_TABLET | Freq: Every day | ORAL | Status: DC

## 2012-07-24 NOTE — Telephone Encounter (Signed)
May have fluoxetine 20mg  tab, 1 tab po qd, #30, RF x 1.  Must have office f/u prior to any FURTHER refills.-thx

## 2012-07-24 NOTE — Telephone Encounter (Signed)
Rx request to pharmacy; **Office Visit Needed Prior to Future Refills**/SLS  

## 2012-07-24 NOTE — Telephone Encounter (Signed)
Per Note In Comment Box of Visit Info Created by Veryl Speak [place]: Fluoxetine [facility]  Last Rx: 11.25.2013, #30x3 Last OV: 12.26.2013 Return: PRN, [no F/U has been scheduled] Please advise on refills/SLS

## 2012-12-26 ENCOUNTER — Other Ambulatory Visit: Payer: Self-pay

## 2013-11-20 ENCOUNTER — Ambulatory Visit (INDEPENDENT_AMBULATORY_CARE_PROVIDER_SITE_OTHER): Payer: 59 | Admitting: Family Medicine

## 2013-11-20 ENCOUNTER — Encounter: Payer: Self-pay | Admitting: Family Medicine

## 2013-11-20 VITALS — BP 125/79 | HR 69 | Temp 99.0°F | Resp 16 | Ht 69.0 in | Wt 187.0 lb

## 2013-11-20 DIAGNOSIS — Z23 Encounter for immunization: Secondary | ICD-10-CM | POA: Diagnosis not present

## 2013-11-20 DIAGNOSIS — F419 Anxiety disorder, unspecified: Principal | ICD-10-CM

## 2013-11-20 DIAGNOSIS — F418 Other specified anxiety disorders: Secondary | ICD-10-CM | POA: Diagnosis not present

## 2013-11-20 DIAGNOSIS — F329 Major depressive disorder, single episode, unspecified: Secondary | ICD-10-CM

## 2013-11-20 MED ORDER — FLUOXETINE HCL 20 MG PO TABS: 20.0000 mg | ORAL_TABLET | Freq: Every day | ORAL | Status: DC

## 2013-11-20 NOTE — Progress Notes (Signed)
OFFICE VISIT  11/25/2013   CC:  Chief Complaint  Patient presents with  . Medication Refill   HPI:    Patient is a 23 y.o. Caucasian male who presents for follow up.  It has been almost 2 years since I saw him last, at which time he was taking prozac and had also recently started taking Latuda.  He is still at [employer] and has 2 more years left but not in school this semester.  Not working at the moment.   He has been off his meds > 1 yr.  He recalls no other meds being tried.  When asked why he stopped these meds he says cost was an issue with Latuda, and he wanted to try doing more exercise and healthier sleep patterns and this did work for a while. However, lately: since being back [place] lately he is  feeling stressed out, can't get more specific for me, trouble sleeping.   Tries to go to be around 11 PM, often has to lay there til 1-2 AM to get to sleep. No excessive energy but some racing thoughts.  Motivation level is down, gets very uncomfortable when I ask about emotional/mood sx's--doesn't seem to want to admit to anything.  Denies hearing voices or having any hallucinations.  Denies SI or HI.  NO panic attacks.  +Irritable, poor concentration lately, keyed up all the time, no signif risk taking behavior, worries chronically about lots of things--money for school, the messiness of his parents home is overwhelming to him, trying to find a job--these are a few examples.  Past Medical History  Diagnosis Date  . Seasonal allergic rhinitis     Clartin helpful.  . Mild persistent asthma     Has been on advair and singulair in the past.  Has gotten better as he's gotten older but still flares some with allergies.  . Obesity, Class I, BMI 30-34.9   . 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  (UDS neg)  . Obsessive-compulsive behavior     Handwashing    Past Surgical History  Procedure Laterality Date  . Toenail excision      MEDS: Claritine 10mg  qd prn+ No Known Allergies  ROS As per HPI  PE: Blood pressure 125/79, pulse 69, temperature 99 F (37.2 C), temperature source Temporal, resp. rate 16, height 5\' 9"  (1.753 m), weight 187 lb (84.823 kg), SpO2 100.00%. Wt Readings from Last 2 Encounters:  11/20/13 187 lb (84.823 kg)  02/15/12 191 lb (86.637 kg)   Gen: alert, oriented x 4, affect pleasant.  Lucid thinking and conversation noted. HEENT: PERRLA, EOMI.   Neck: no LAD, mass, or thyromegaly. CV: RRR, no m/r/g LUNGS: CTA bilat, nonlabored. NEURO: no tremor or tics noted on observation.  Coordination intact. CN 2-12 grossly intact bilaterally, strength 5/5 in all extremeties.  No ataxia.  LABS:  none  IMPRESSION AND PLAN:  Anxiety and depression Anxiety features >>depressive features. He has definite OCD tendencies as well. Prozac has helped him in the past. Will start prozac 20mg  po qd.  Therapeutic expectations and side effect profile of medication discussed today.  Patient's questions answered. F/u 1 mo. Flu vaccine IM given today.   An After Visit Summary was printed and given to the patient.  FOLLOW UP: Return in about  4 weeks (around 12/18/2013) for f/u mood/anx d/o.

## 2013-11-20 NOTE — Progress Notes (Signed)
Pre visit review using our clinic review tool, if applicable. No additional management support is needed unless otherwise documented below in the visit note. 

## 2013-11-25 ENCOUNTER — Encounter: Payer: Self-pay | Admitting: Family Medicine

## 2013-11-25 DIAGNOSIS — F32A Depression, unspecified: Secondary | ICD-10-CM | POA: Insufficient documentation

## 2013-11-25 DIAGNOSIS — F329 Major depressive disorder, single episode, unspecified: Secondary | ICD-10-CM | POA: Insufficient documentation

## 2013-11-25 DIAGNOSIS — F419 Anxiety disorder, unspecified: Principal | ICD-10-CM

## 2013-11-25 NOTE — Assessment & Plan Note (Signed)
Anxiety features >>depressive features. He has definite OCD tendencies as well. Prozac has helped him in the past. Will start prozac 20mg  po qd.  Therapeutic expectations and side effect profile of medication discussed today.  Patient's questions answered. F/u 1 mo. Flu vaccine IM given today.

## 2013-12-15 ENCOUNTER — Encounter: Payer: Self-pay | Admitting: Family Medicine

## 2013-12-15 ENCOUNTER — Ambulatory Visit (INDEPENDENT_AMBULATORY_CARE_PROVIDER_SITE_OTHER): Payer: 59 | Admitting: Family Medicine

## 2013-12-15 VITALS — BP 111/73 | HR 89 | Temp 98.9°F | Resp 18 | Ht 69.0 in | Wt 183.0 lb

## 2013-12-15 DIAGNOSIS — F329 Major depressive disorder, single episode, unspecified: Secondary | ICD-10-CM

## 2013-12-15 DIAGNOSIS — F418 Other specified anxiety disorders: Secondary | ICD-10-CM

## 2013-12-15 DIAGNOSIS — F32A Depression, unspecified: Secondary | ICD-10-CM

## 2013-12-15 DIAGNOSIS — F419 Anxiety disorder, unspecified: Principal | ICD-10-CM

## 2013-12-15 MED ORDER — FLUOXETINE HCL 20 MG PO TABS: 20.0000 mg | ORAL_TABLET | Freq: Every day | ORAL | Status: DC

## 2013-12-15 NOTE — Progress Notes (Signed)
Pre visit review using our clinic review tool, if applicable. No additional management support is needed unless otherwise documented below in the visit note. 

## 2013-12-15 NOTE — Progress Notes (Signed)
OFFICE NOTE  12/15/2013  CC:  Chief Complaint  Patient presents with  . Follow-up     HPI: Patient is a 23 y.o. Caucasian male who is here for 3 week f/u anxiety/mood d/o. Started prozac 20mg  qd at last visit.  Has some OCD tendencies as well. Feeling better!  Sleeping is improved, energy and motivation are up some. Has a girlfriend now and this is helping with his mood/anxiety. No side effects noted from med. He is looking for a short term job while waiting to go back to [employer] for spring semester.   Pertinent PMH:  Past medical, surgical, social, and family history reviewed and no changes are noted since last office visit.  MEDS:  Outpatient Prescriptions Prior to Visit  Medication Sig Dispense Refill  . EPINEPHrine (EPIPEN) 0.3 mg/0.3 mL DEVI Inject 0.3 mLs (0.3 mg total) into the muscle as needed.  1 Device  1  . FLUoxetine (PROZAC) 20 MG tablet Take 1 tablet (20 mg total) by mouth daily.  30 tablet  1  . loratadine (CLARITIN REDITABS) 10 MG dissolvable tablet Take 10 mg by mouth daily.       No facility-administered medications prior to visit.    PE: Blood pressure 111/73, pulse 89, temperature 98.9 F (37.2 C), temperature source Temporal, resp. rate 18, height 5\' 9"  (1.753 m), weight 183 lb (83.008 kg), SpO2 96.00%. Wt Readings from Last 2 Encounters:  12/15/13 183 lb (83.008 kg)  11/20/13 187 lb (84.823 kg)   Gen: alert, oriented x 4, affect pleasant.  Lucid thinking and conversation noted. HEENT: PERRLA, EOMI.   Neck: no LAD, mass, or thyromegaly. CV: RRR, no m/r/g LUNGS: CTA bilat, nonlabored. NEURO: no tremor or tics noted on observation.  Coordination intact. CN 2-12 grossly intact bilaterally, strength 5/5 in all extremeties.  No ataxia.   IMPRESSION AND PLAN:  GAD; improved. Continue prozac 20mg  qd, rx renewed today.  An After Visit Summary was printed and given to the patient.  FOLLOW UP: 2 mo

## 2013-12-17 ENCOUNTER — Ambulatory Visit: Payer: BC Managed Care – PPO | Admitting: Family Medicine

## 2014-02-23 ENCOUNTER — Other Ambulatory Visit: Payer: Self-pay | Admitting: Family Medicine

## 2014-02-23 MED ORDER — FLUOXETINE HCL 20 MG PO TABS: 20.0000 mg | ORAL_TABLET | Freq: Every day | ORAL | Status: AC

## 2018-02-07 ENCOUNTER — Encounter: Payer: Self-pay | Admitting: Emergency Medicine

## 2018-02-07 ENCOUNTER — Other Ambulatory Visit: Payer: Self-pay

## 2018-02-07 ENCOUNTER — Emergency Department
Admission: EM | Admit: 2018-02-07 | Discharge: 2018-02-07 | Disposition: A | Discharge status: Home / Self Care | Attending: Emergency Medicine | Admitting: Emergency Medicine

## 2018-02-07 ENCOUNTER — Emergency Department

## 2018-02-07 DIAGNOSIS — R51 Headache: Secondary | ICD-10-CM | POA: Insufficient documentation

## 2018-02-07 DIAGNOSIS — H5713 Ocular pain, bilateral: Secondary | ICD-10-CM | POA: Diagnosis not present

## 2018-02-07 DIAGNOSIS — J4530 Mild persistent asthma, uncomplicated: Secondary | ICD-10-CM | POA: Insufficient documentation

## 2018-02-07 DIAGNOSIS — R519 Headache, unspecified: Secondary | ICD-10-CM

## 2018-02-07 HISTORY — DX: Anxiety disorder, unspecified: F41.9

## 2018-02-07 LAB — CBC
HEMATOCRIT: 43.5 % (ref 39.0–52.0)
Hemoglobin: 14.9 g/dL (ref 13.0–17.0)
MCH: 29.8 pg (ref 26.0–34.0)
MCHC: 34.3 g/dL (ref 30.0–36.0)
MCV: 87 fL (ref 80.0–100.0)
NRBC: 0 % (ref 0.0–0.2)
Platelets: 209 10*3/uL (ref 150–400)
RBC: 5 MIL/uL (ref 4.22–5.81)
RDW: 12.6 % (ref 11.5–15.5)
WBC: 6.7 10*3/uL (ref 4.0–10.5)

## 2018-02-07 LAB — COMPREHENSIVE METABOLIC PANEL
ALBUMIN: 4.8 g/dL (ref 3.5–5.0)
ALT: 17 U/L (ref 0–44)
AST: 22 U/L (ref 15–41)
Alkaline Phosphatase: 39 U/L (ref 38–126)
Anion gap: 7 (ref 5–15)
BILIRUBIN TOTAL: 0.9 mg/dL (ref 0.3–1.2)
BUN: 16 mg/dL (ref 6–20)
CHLORIDE: 104 mmol/L (ref 98–111)
CO2: 27 mmol/L (ref 22–32)
Calcium: 9.6 mg/dL (ref 8.9–10.3)
Creatinine, Ser: 0.75 mg/dL (ref 0.61–1.24)
GFR calc Af Amer: >60 mL/min (ref >60)
GLUCOSE: 94 mg/dL (ref 70–99)
POTASSIUM: 4 mmol/L (ref 3.5–5.1)
Sodium: 138 mmol/L (ref 135–145)
TOTAL PROTEIN: 8 g/dL (ref 6.5–8.1)

## 2018-02-07 NOTE — ED Notes (Signed)
See triage note  Presents with intermittent eye pain and headaches  sxs' started about 1-2 months ago   He is pointing to left side of head but states he has sxs' to both  Denies any trauma,fever or vision problems

## 2018-02-07 NOTE — ED Triage Notes (Signed)
Pt to ED from home c/o right eye pain, intermittent visual changes and intermittent posterior headache for over a month.  Denies current visual changes or pain, in NAD at this time.  No protocols at this time per Dr. Cyril LoosenKinner.  Okay for flex.

## 2018-02-07 NOTE — Discharge Instructions (Addendum)
Your blood work was all within normal limits.  Your CT scan does not show any abnormalities.  Please follow-up with your primary care doctor for further evaluation.  I have also given you referrals to ophthalmology and neurology.  Return to the emergency department for any worsening or increase in frequency of symptoms.

## 2018-02-07 NOTE — ED Provider Notes (Signed)
[facility] Emergency Department Provider Note  ____________________________________________  Time seen: Approximately 3:12 PM  I have reviewed the triage vital signs and the nursing notes.   HISTORY  Chief Complaint Eye Pain    HPI Calvin Bray is a 27 y.o. male that presents emergency department for evaluation of intermittent headaches for 2 months and 3 episodes of bilateral eye "pulsations" for 1 month.  He had a left-sided headache that lasted 2 seconds this morning.  This afternoon there was 1 minute where he felt like both of his eyes were throbbing. Eye sensation is not unilateral.  He was having headaches every day but they are decreasing now has them about 2-3 times per week.  Headaches change in position and happen on all sides of his head.  Last episode of eye pulsations until this morning was 2 weeks ago.  He had an episode at the pool 3 to 4 months ago where he saw flashers to his eyes.  He wears glasses.  Last eye exam was 1 year ago. He states that he has had 2 family members that were diagnosed with brain cancer recently and this is his primary concern.  He restarted Vyvanse a couple of months ago but was on this last year as well.  No recent illness.  No trauma. No headache or eye symptoms currently.  No fever, eye pain, red eye, visual changes, blurry vision, neck pain, photophobia, dizziness, confusion, nausea, vomiting, abdominal pain.   Past Medical History:  Diagnosis Date  . Anxiety   . 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 (UDS neg)  . Mild persistent asthma    Has been on advair and singulair in the past.  Has gotten better as he's gotten older but still flares some with allergies.  . Obesity, Class I, BMI 30-34.9   . Obsessive-compulsive behavior    Handwashing  . Seasonal allergic rhinitis    Clartin helpful.    Patient Active Problem List   Diagnosis Date Noted  . Anxiety and depression 11/25/2013  . Need for Menactra vaccination 02/15/2012  . OCD (obsessive compulsive disorder) 01/15/2012  . Obesity, Class I, BMI 30-34.9 07/14/2011    Past Surgical History:  Procedure Laterality Date  . TOENAIL EXCISION      Prior to Admission medications   Medication Sig Start Date End Date Taking? Authorizing Provider  EPINEPHrine (EPIPEN) 0.3 mg/0.3 mL DEVI Inject 0.3 mLs (0.3 mg total) into the muscle as needed. 12/02/11   Gwyneth SproutPlunkett, Whitney, MD  FLUoxetine (PROZAC) 20 MG tablet Take 1 tablet (20 mg total) by mouth daily. 02/23/14   McGowen, Maryjean MornPhilip H, MD  loratadine (CLARITIN REDITABS) 10 MG dissolvable tablet Take 10 mg by mouth daily.    [provider]    Allergies Patient has no known allergies.  Family History  Problem Relation Age of Onset  . Hypothyroidism Mother   . Heart disease Mother        CAD onset in her mid 5440s  . Cancer Sister        ovarian cancer  . ADD / ADHD Brother   . Cancer Maternal Grandfather        lung cancer, died prior to age 27    Social History Social History   Tobacco Use  . Smoking status: Never Smoker  .  Smokeless tobacco: Never Used  Substance Use Topics  . Alcohol use: No    Comment: REPORTS THAT HE HAS A DRINK(BEER)MAYBE ONCE A MONTH NEVER TO THE POINT OF INTOXICATION  . Drug use: No     Review of Systems  Constitutional: No fever/chills ENT: No upper respiratory complaints. Cardiovascular: No chest pain. Respiratory: No cough. No SOB. Gastrointestinal: No abdominal pain.  No nausea, no vomiting.  Musculoskeletal: Negative for musculoskeletal pain. Skin: Negative for rash, abrasions, lacerations, ecchymosis. Neurological: Negative for  numbness or tingling. Positive for headache.   ____________________________________________   PHYSICAL EXAM:  VITAL SIGNS: ED  Triage Vitals  Enc Vitals Group     BP 02/07/18 1429 133/82     Pulse Rate 02/07/18 1429 67     Resp 02/07/18 1429 16     Temp 02/07/18 1429 98.4 F (36.9 C)     Temp Source 02/07/18 1429 Oral     SpO2 02/07/18 1429 99 %     Weight 02/07/18 1430 180 lb (81.6 kg)     Height 02/07/18 1430 5\' 9"  (1.753 m)     Head Circumference --      Peak Flow --      Pain Score 02/07/18 1430 0     Pain Loc --      Pain Edu? --      Excl. in GC? --      Constitutional: Alert and oriented. Well appearing and in no acute distress. Eyes: Conjunctivae are normal. PERRL. EOMI. Glasses Head: Atraumatic. ENT:      Ears:      Nose: No congestion/rhinnorhea.      Mouth/Throat: Mucous membranes are moist.  Neck: No stridor.  Cardiovascular: Normal rate, regular rhythm.  Good peripheral circulation. Respiratory: Normal respiratory effort without tachypnea or retractions. Lungs CTAB. Good air entry to the bases with no decreased or absent breath sounds. Musculoskeletal: Full range of motion to all extremities. No gross deformities appreciated. Neurologic:  Normal speech and language. No gross focal neurologic deficits are appreciated.  Skin:  Skin is warm, dry and intact. No rash noted. Psychiatric: Mood and affect are normal. Speech and behavior are normal. Patient exhibits appropriate insight and judgement.   ____________________________________________   LABS (all labs ordered are listed, but only abnormal results are displayed)  Labs Reviewed  CBC  COMPREHENSIVE METABOLIC PANEL   ____________________________________________  EKG   ____________________________________________  RADIOLOGY Calvin Bray, personally viewed and evaluated these images (plain radiographs) as part of my medical decision making, as well as reviewing the written report by the radiologist.  Ct Head Wo Contrast  Result Date: 02/07/2018 CLINICAL DATA:  Patient with right eye pain. Intermittent visual changes.  EXAM: CT HEAD WITHOUT CONTRAST TECHNIQUE: Contiguous axial images were obtained from the base of the skull through the vertex without intravenous contrast. COMPARISON:  None. FINDINGS: Brain: Ventricles and sulci are appropriate for patient's age. No evidence for acute cortically based infarct, intracranial hemorrhage, mass lesion or mass-effect. Vascular: Unremarkable Skull: Intact Sinuses/Orbits: Paranasal sinuses well aerated. Mastoid air cells unremarkable. Orbits unremarkable. Other: None. IMPRESSION: No acute intracranial process. Electronically Signed   By: Annia Belt M.D.   On: 02/07/2018 18:00    ____________________________________________    PROCEDURES  Procedure(s) performed:    Procedures    Medications - No data to display   ____________________________________________   INITIAL IMPRESSION / ASSESSMENT AND PLAN / ED COURSE  Pertinent labs & imaging results that were available during my care of the  patient were reviewed by me and considered in my medical decision making (see chart for details).  Review of the [place] CSRS was performed in accordance of the [facility] prior to dispensing any controlled drugs.  ----------------------------------------- 4:59 PM on 02/07/2018 ----------------------------------------- Patient has been on the phone with his insurance company in the billing department trying to decide whether CT scan will be covered.  He has still declined CT scan at this time.    Patient presented to emergency department for evaluation of intermittent headaches for 2 months.  Vital signs and exam are reassuring.  Lab work is unremarkable.  CT head is negative for acute abnormalities.  Patient has not had any symptoms while in the emergency department.   Patient seems anxious about his family members that were recently diagnosed with brain cancer.  I think that anxiety is contributing.  Patient will follow-up with ophthalmology for eye exam and neurology for further  work-up.   Patient is given ED precautions to return to the ED for any worsening or new symptoms.     ____________________________________________  FINAL CLINICAL IMPRESSION(S) / ED DIAGNOSES  Final diagnoses:  Acute intractable headache, unspecified headache type  Discomfort of both eyes      NEW MEDICATIONS STARTED DURING THIS VISIT:  ED Discharge Orders    None          This chart was dictated using voice recognition software/Dragon. Despite best efforts to proofread, errors can occur which can change the meaning. Any change was purely unintentional.    Enid DerryWagner, Shiquita Collignon, PA-C 02/07/18 Angelique Blonder1920    Siadecki, Sebastian, MD 02/07/18 [phone]

## 2019-12-27 IMAGING — CT CT HEAD W/O CM
3 series · 15 of 47 positions shown, 18 images · non-contrast
Comparison: None.

CLINICAL DATA: Patient with right eye pain. Intermittent visual
changes.

EXAM:
CT HEAD WITHOUT CONTRAST
TECHNIQUE: Contiguous axial images were obtained from the base of the skull
through the vertex without intravenous contrast.

[Series 2: head wo · axial · 0.43mm/px · z∈[-118,+7]mm · 9 of 30 slices shown, 12 images]
[im 3/30  brain]
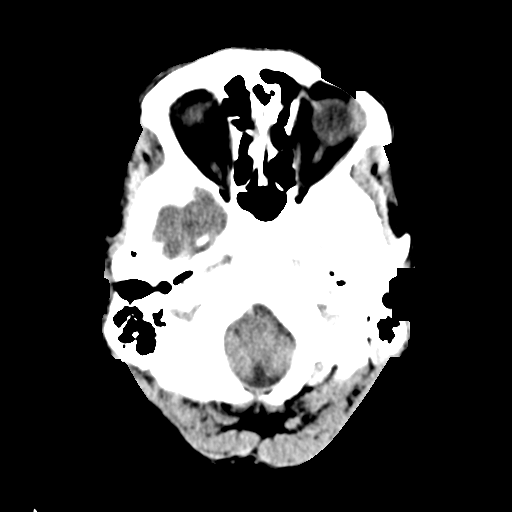
[im 3/30  bone]
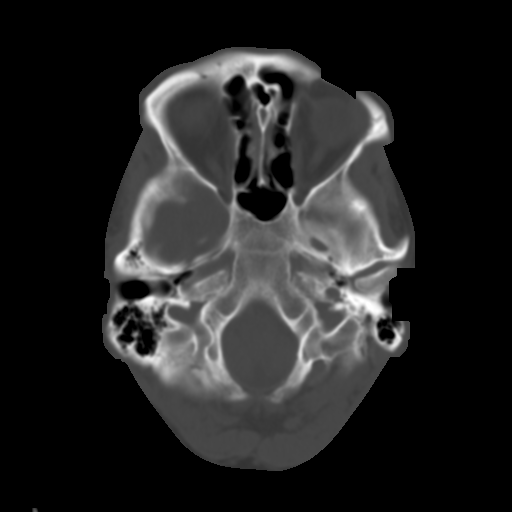
[im 6/30  brain]
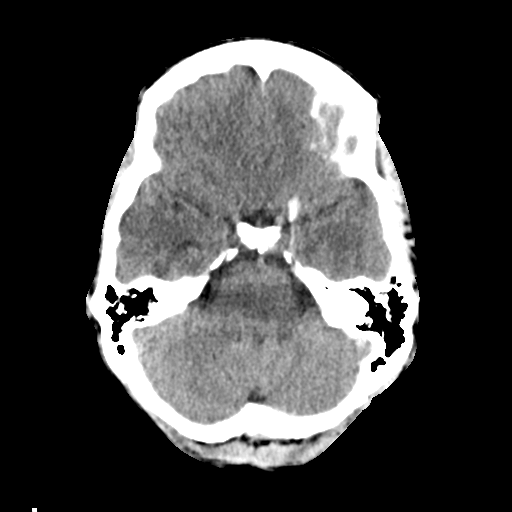
[im 9/30  brain]
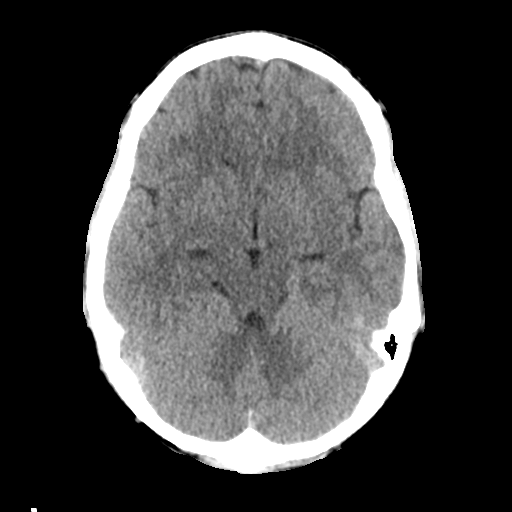
[im 12/30  brain]
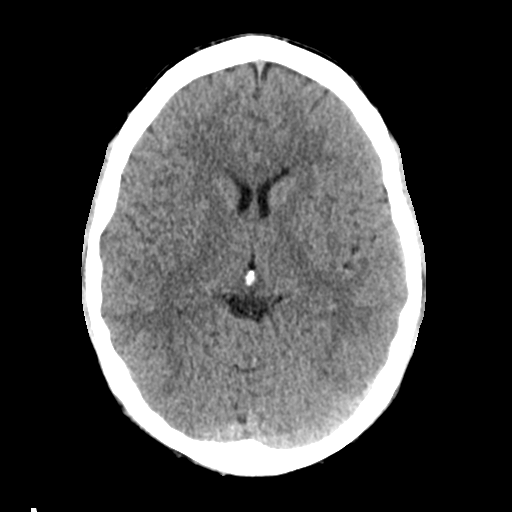
[im 16/30  brain]
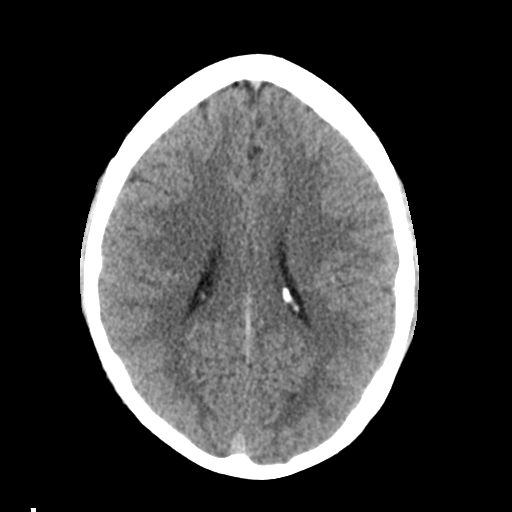
[im 16/30  bone]
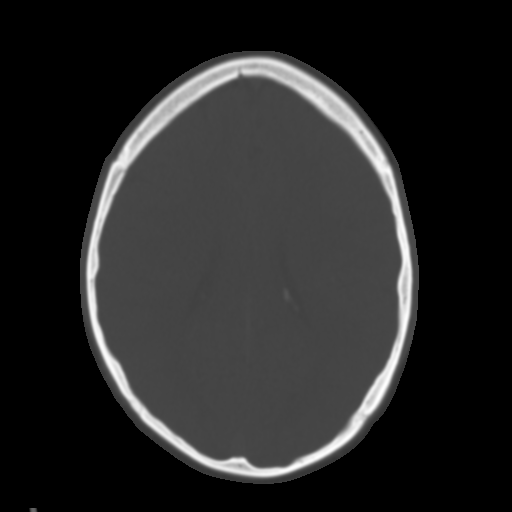
[im 19/30  brain]
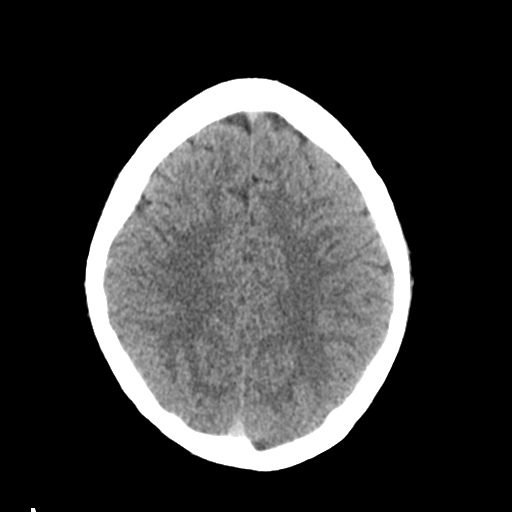
[im 22/30  brain]
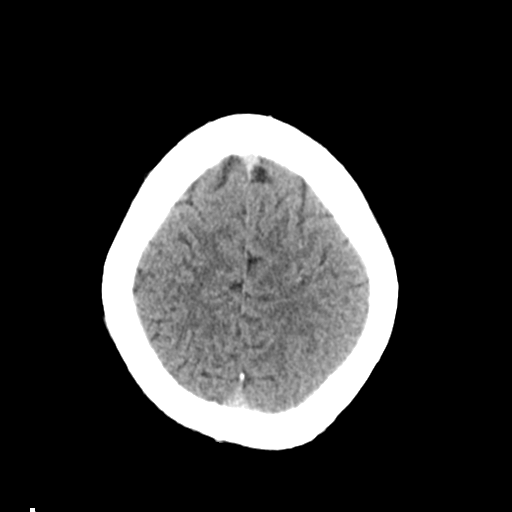
[im 25/30  brain]
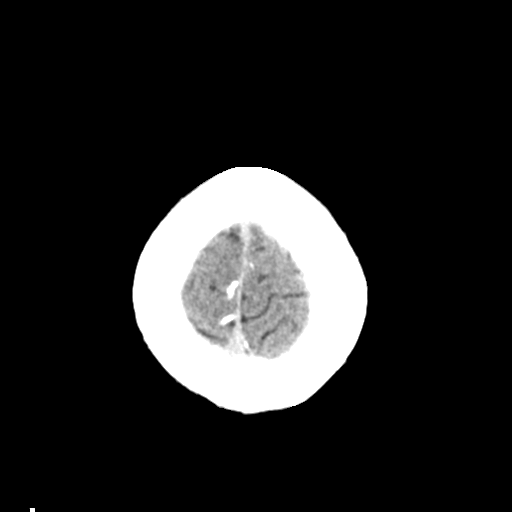
[im 28/30  brain]
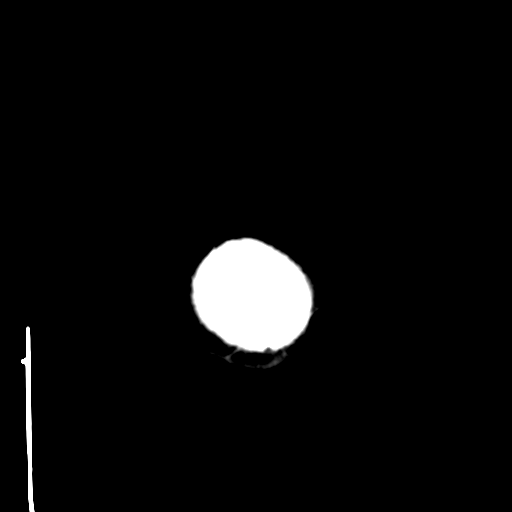
[im 28/30  bone]
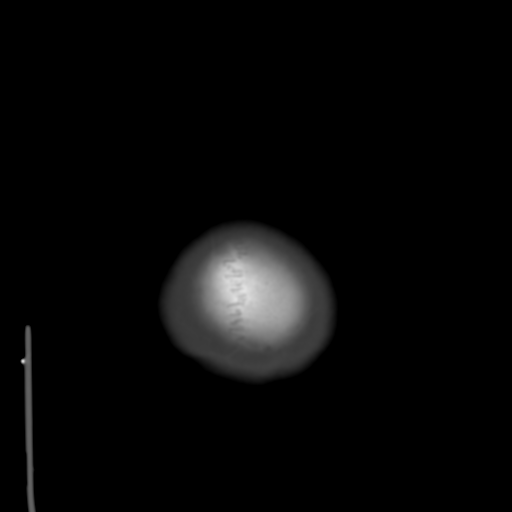

[Series 4: coronal soft tissue · coronal · 0.33mm/px · 3 of 67 slices shown]
[im 23/67  brain]
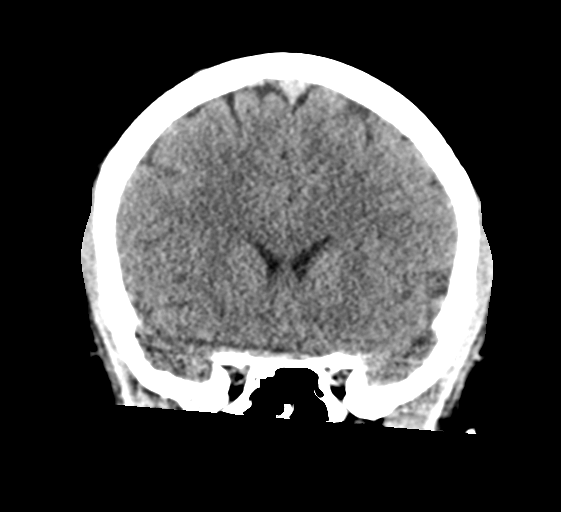
[im 30/67  brain]
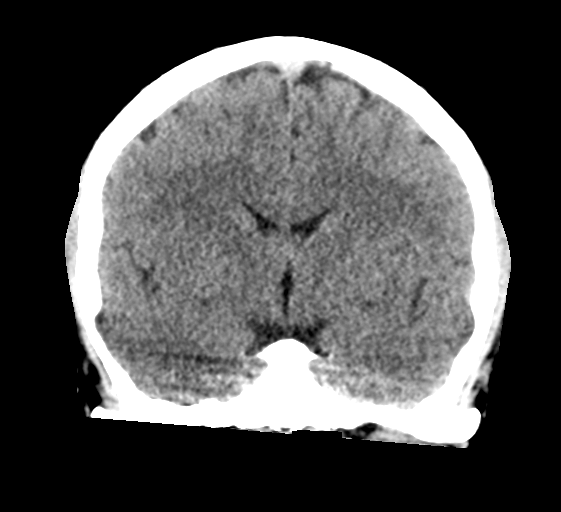
[im 37/67  brain]
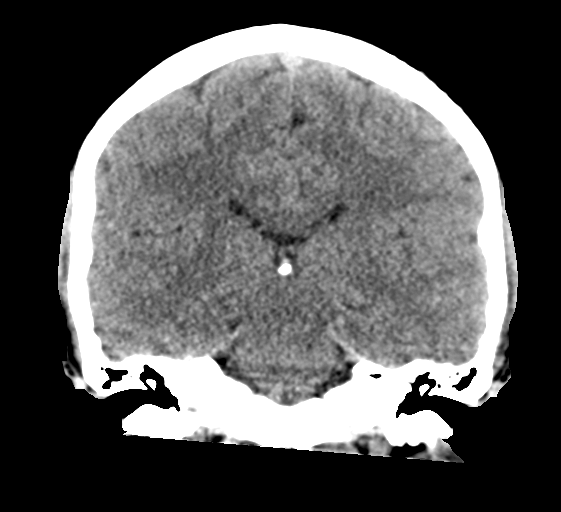

[Series 5: sagittal soft tissue · sagittal · 0.32mm/px · 3 of 55 slices shown]
[im 19/55  brain]
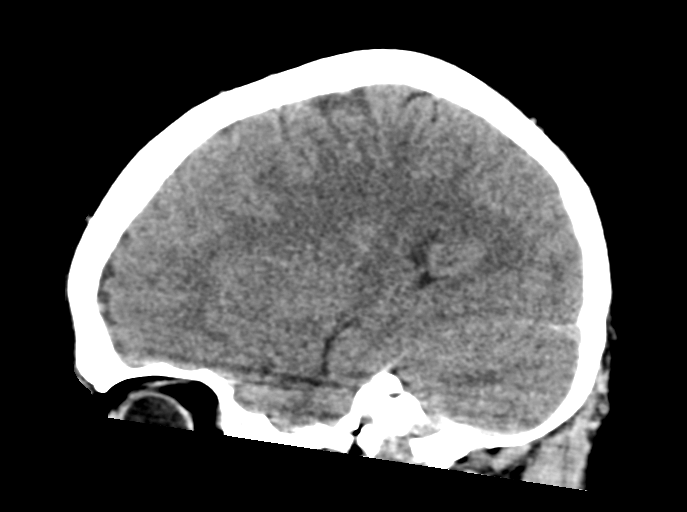
[im 28/55  brain]
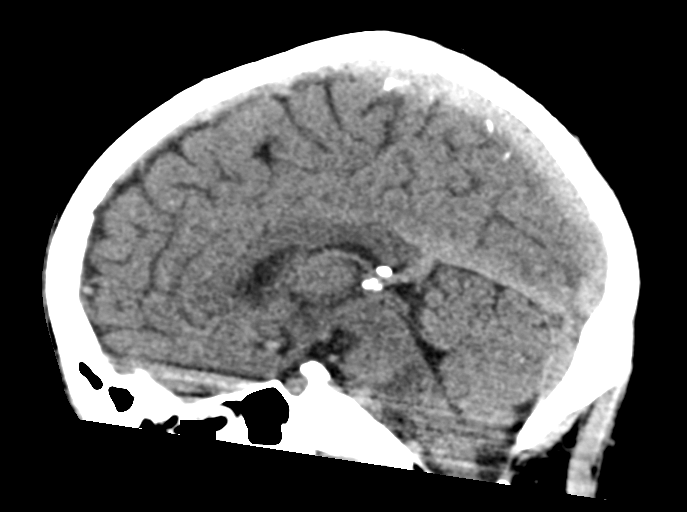
[im 37/55  brain]
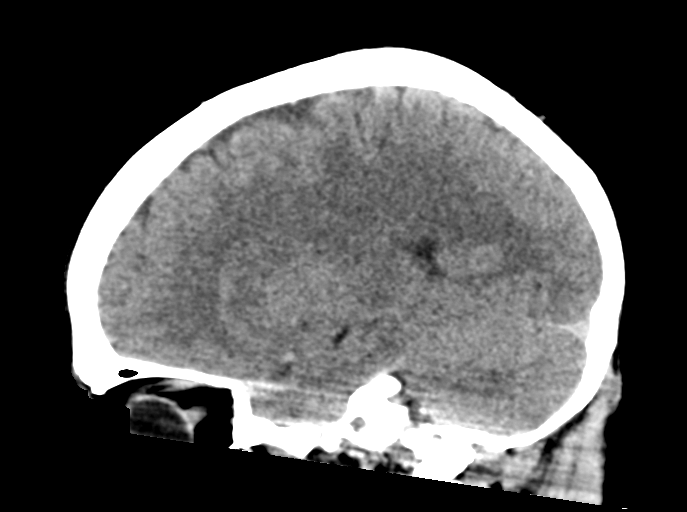

[15 of 47 positions shown; findings below may reference images not displayed]

FINDINGS: Brain: Ventricles and sulci are appropriate for patient's age. No
evidence for acute cortically based infarct, intracranial
hemorrhage, mass lesion or mass-effect.

Vascular: Unremarkable

Skull: Intact

Sinuses/Orbits: Paranasal sinuses well aerated. Mastoid air cells
unremarkable. Orbits unremarkable.

Other: None.
IMPRESSION: No acute intracranial process.
